# Patient Record
Sex: Male | Born: 1975 | Race: White | Hispanic: No
Health system: Midwestern US, Academic
[De-identification: ages and names within clinical notes are randomized; demographics above are authoritative.]

---

## 2019-01-15 IMAGING — CR CHEST
2 series · 2 of 2 positions shown · non-contrast
Comparison: none

[shoulder external]
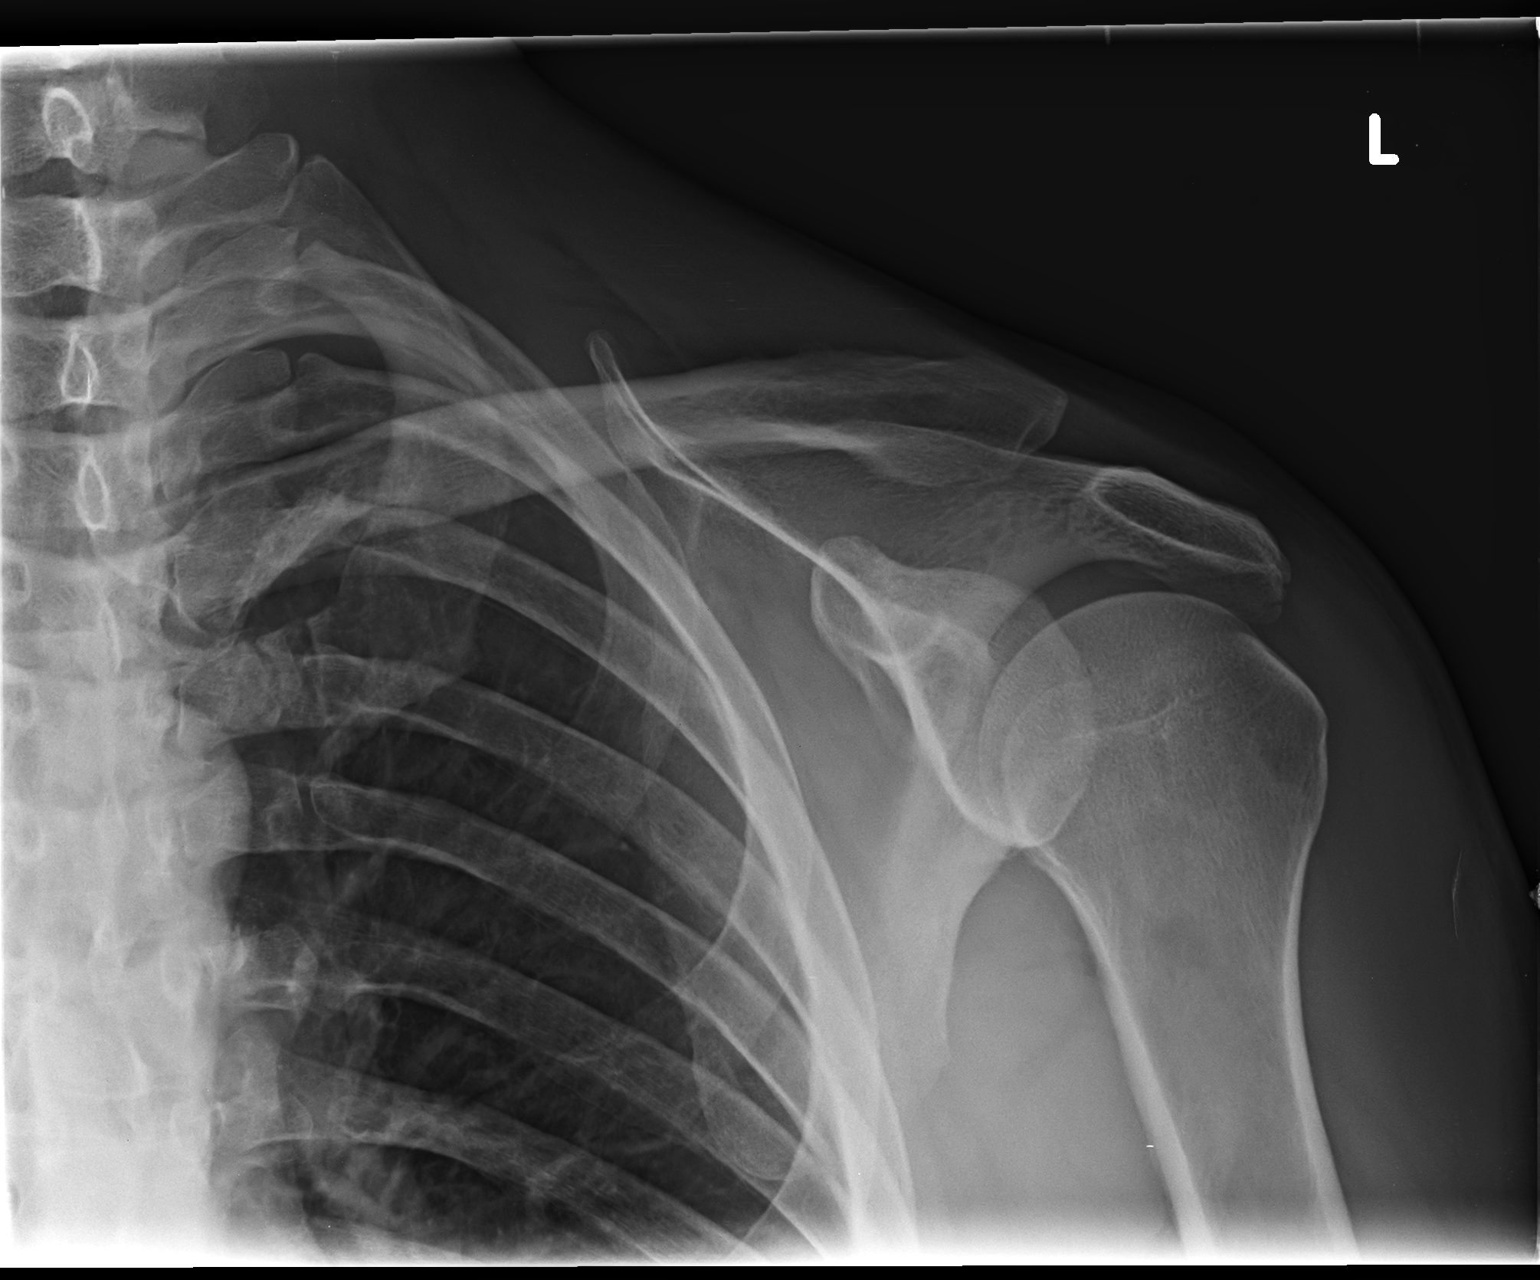

[shoulder y-view]
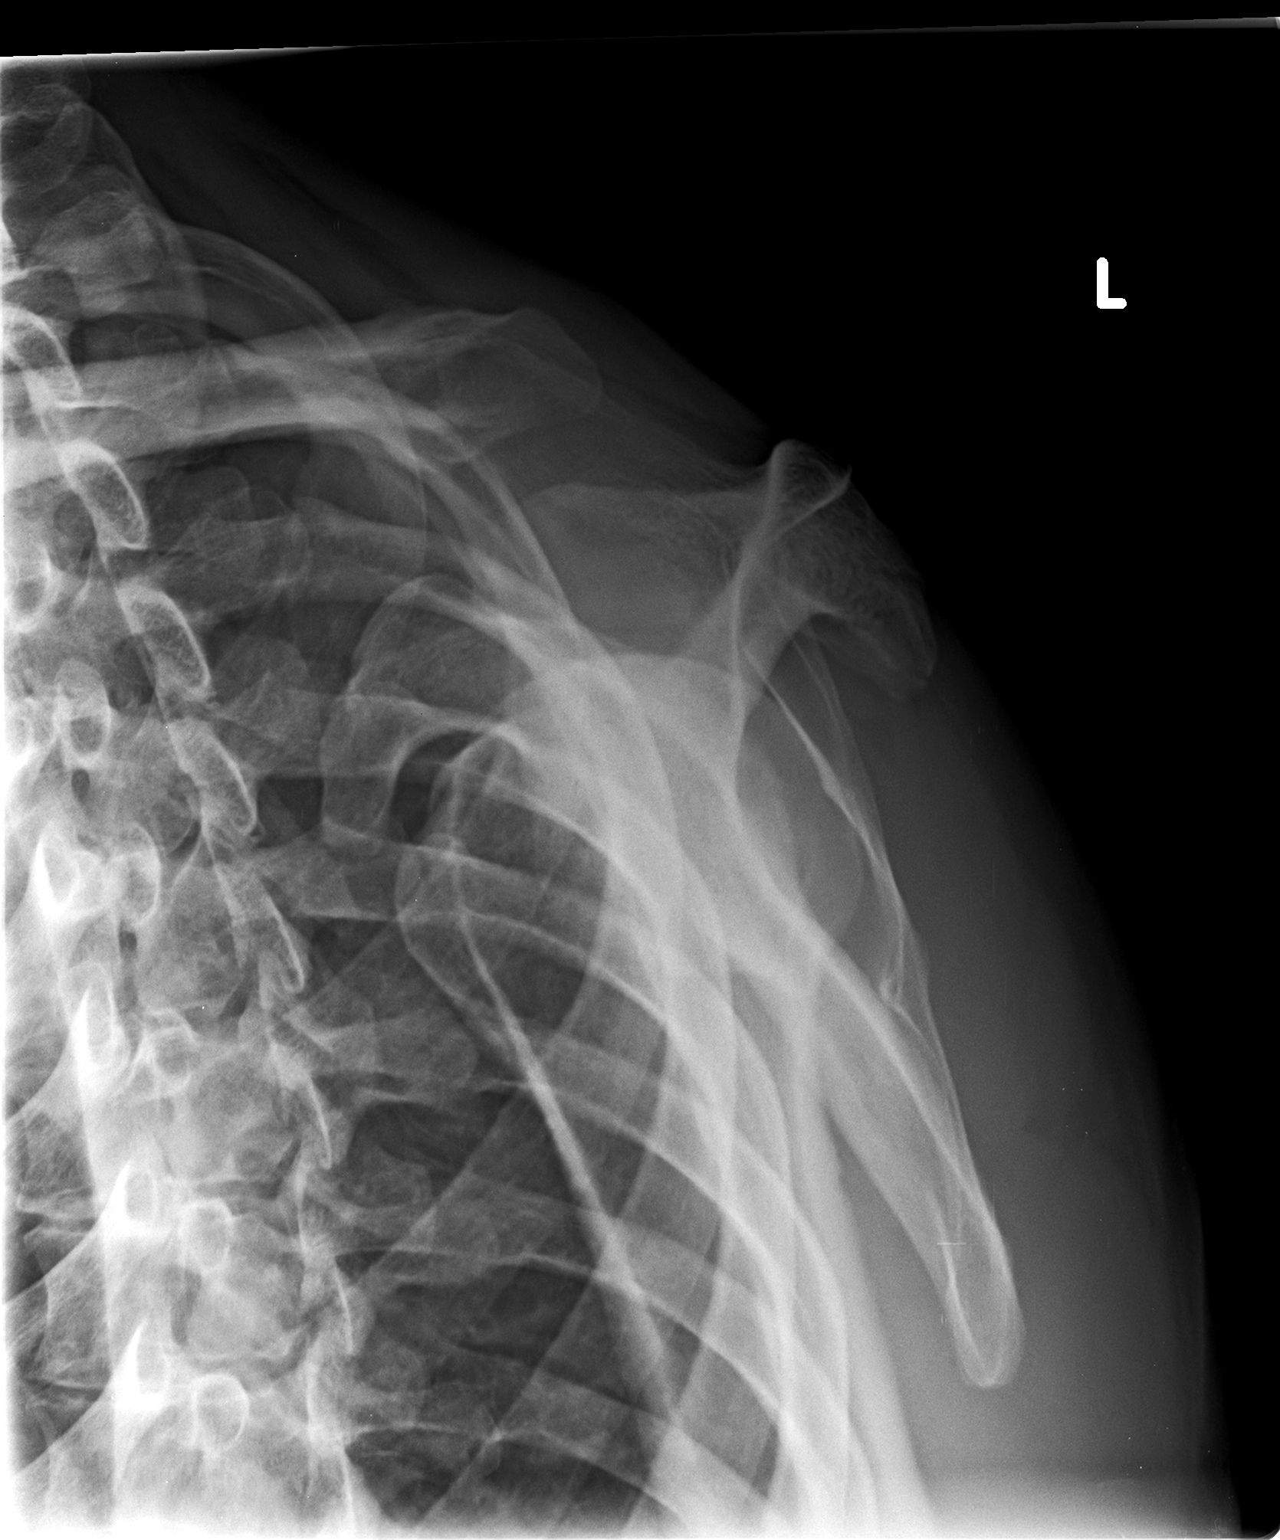

[2 of 2 positions shown; findings below may reference images not displayed]

EXAM

Two views of the left shoulder, one view of the acromioclavicular joints

INDICATION

Pain

TECHNIQUE

Two views left shoulder. One view of the acromioclavicular joints.

COMPARISONS

None available at the time of dictation.

FINDINGS

There is widening of the left acromioclavicular joint with mild elevation of the left distal
clavicle relative to the lateral acromion. There is also increased coracoclavicular distance.
Imaging findings are suggestive of at least grade 3 acromioclavicular separation with disruption of
the acromioclavicular joint and coracoclavicular ligaments.

No definitive acute fracture.

IMPRESSION
- Left-sided acromioclavicular separation as detailed above.

Tech Notes:

lt. shoulder pain followin [facility]. bilat ac joints for compare.

## 2021-02-03 ENCOUNTER — Inpatient Hospital Stay

## 2021-02-03 ENCOUNTER — Encounter: Admit: 2021-02-03 | Discharge: 2021-02-03

## 2021-02-03 MED ORDER — OLANZAPINE 5 MG PO TBDI
5 mg | ORAL | 0 refills | PRN
Start: 2021-02-03 — End: ?

## 2021-02-03 MED ORDER — CALCIUM CARBONATE 200 MG CALCIUM (500 MG) PO CHEW
1000 mg | Freq: Three times a day (TID) | ORAL | 0 refills | PRN
Start: 2021-02-03 — End: ?

## 2021-02-03 MED ORDER — TRAZODONE 50 MG PO TAB
50 mg | Freq: Every evening | ORAL | 0 refills | PRN
Start: 2021-02-03 — End: ?

## 2021-02-03 MED ORDER — ACETAMINOPHEN 325 MG PO TAB
650 mg | ORAL | 0 refills | PRN
Start: 2021-02-03 — End: ?

## 2021-02-03 MED ORDER — OLANZAPINE 10 MG IM SOLR
5 mg | INTRAMUSCULAR | 0 refills | PRN
Start: 2021-02-03 — End: ?

## 2021-02-03 MED ORDER — HYDROXYZINE HCL 25 MG PO TAB
25 mg | ORAL | 0 refills | PRN
Start: 2021-02-03 — End: ?
  Administered 2021-02-04 – 2021-02-05 (×4): 25 mg via ORAL

## 2021-02-03 MED ORDER — POLYETHYLENE GLYCOL 3350 17 GRAM PO PWPK
1 | Freq: Every day | ORAL | 0 refills | PRN
Start: 2021-02-03 — End: ?

## 2021-02-04 ENCOUNTER — Encounter: Admit: 2021-02-04 | Discharge: 2021-02-04 | Payer: BC Managed Care – PPO | Primary: Family

## 2021-02-04 DIAGNOSIS — W540XXA Bitten by dog, initial encounter: Secondary | ICD-10-CM

## 2021-02-04 DIAGNOSIS — F329 Major depressive disorder, single episode, unspecified: Secondary | ICD-10-CM

## 2021-02-04 DIAGNOSIS — S61519A Laceration without foreign body of unspecified wrist, initial encounter: Secondary | ICD-10-CM

## 2021-02-04 DIAGNOSIS — F99 Mental disorder, not otherwise specified: Secondary | ICD-10-CM

## 2021-02-04 MED ADMIN — NICOTINE (POLACRILEX) 4 MG BU GUM [10718]: 4 mg | BUCCAL | @ 17:00:00 | NDC 00536137223

## 2021-02-04 MED ADMIN — VENLAFAXINE 150 MG PO CP24 [78649]: 150 mg | ORAL | @ 17:00:00 | NDC 00904647061

## 2021-02-05 ENCOUNTER — Encounter: Admit: 2021-02-05 | Discharge: 2021-02-05 | Payer: BC Managed Care – PPO | Primary: Family

## 2021-02-05 MED ADMIN — VENLAFAXINE 150 MG PO CP24 [78649]: 150 mg | ORAL | @ 14:00:00 | Stop: 2021-02-05 | NDC 00904647061

## 2021-02-05 MED ADMIN — TRAZODONE 50 MG PO TAB [8085]: 50 mg | ORAL | @ 01:00:00 | NDC 00904686861

## 2021-02-05 MED FILL — VENLAFAXINE 150 MG PO CP24: 150 mg | ORAL | 30 days supply | Qty: 30 | Fill #1 | Status: CP

## 2021-02-05 MED FILL — TRAZODONE 50 MG PO TAB: 50 mg | ORAL | 30 days supply | Qty: 30 | Fill #1 | Status: CP

## 2021-02-05 MED FILL — HYDROXYZINE HCL 25 MG PO TAB: 25 mg | ORAL | 8 days supply | Qty: 30 | Fill #1 | Status: CP

## 2021-02-09 ENCOUNTER — Encounter: Admit: 2021-02-09 | Discharge: 2021-02-09 | Payer: BC Managed Care – PPO | Primary: Family

## 2021-02-09 NOTE — Telephone Encounter
Confirmation this is the pt the call is intended for (name/DOB): yes    Any questions about DC instructions? no    Did you have any difficulty filling Rx's? no    Any questions about follow-up appointments? no    Did you receive all items at discharge that you came in with?: yes    Any other questions/comments: none

## 2021-06-29 ENCOUNTER — Encounter: Admit: 2021-06-29 | Discharge: 2021-06-29 | Payer: BC Managed Care – PPO | Primary: Family

## 2023-12-29 ENCOUNTER — Encounter: Admit: 2023-12-29 | Discharge: 2023-12-29 | Primary: Family

## 2023-12-29 ENCOUNTER — Emergency Department: Admit: 2023-12-29 | Discharge: 2023-12-29 | Disposition: A | Discharge status: Home / Self Care

## 2024-01-02 ENCOUNTER — Encounter: Admit: 2024-01-02 | Discharge: 2024-01-02 | Primary: Family

## 2024-03-20 ENCOUNTER — Encounter: Admit: 2024-03-20 | Discharge: 2024-03-20 | Payer: PRIVATE HEALTH INSURANCE | Primary: Family

## 2024-07-08 ENCOUNTER — Encounter: Admit: 2024-07-08 | Discharge: 2024-07-08 | Payer: PRIVATE HEALTH INSURANCE | Primary: Family
